# Patient Record
Sex: Male | Born: 1965 | Race: White | Hispanic: No | Marital: Married | Smoking: Never smoker
Health system: Southern US, Community
[De-identification: ages and names within clinical notes are randomized; demographics above are authoritative.]

## PROBLEM LIST (undated history)

## (undated) DIAGNOSIS — N289 Disorder of kidney and ureter, unspecified: Secondary | ICD-10-CM

## (undated) DIAGNOSIS — N200 Calculus of kidney: Secondary | ICD-10-CM

## (undated) DIAGNOSIS — I10 Essential (primary) hypertension: Secondary | ICD-10-CM

## (undated) DIAGNOSIS — E7800 Pure hypercholesterolemia, unspecified: Secondary | ICD-10-CM

---

## 2014-03-23 ENCOUNTER — Encounter (HOSPITAL_BASED_OUTPATIENT_CLINIC_OR_DEPARTMENT_OTHER): Payer: Self-pay | Admitting: Emergency Medicine

## 2014-03-23 ENCOUNTER — Emergency Department (HOSPITAL_BASED_OUTPATIENT_CLINIC_OR_DEPARTMENT_OTHER): Payer: PRIVATE HEALTH INSURANCE

## 2014-03-23 ENCOUNTER — Emergency Department (HOSPITAL_BASED_OUTPATIENT_CLINIC_OR_DEPARTMENT_OTHER)
Admission: EM | Admit: 2014-03-23 | Discharge: 2014-03-23 | Disposition: A | Discharge status: Home / Self Care | Payer: PRIVATE HEALTH INSURANCE | Attending: Emergency Medicine | Admitting: Emergency Medicine

## 2014-03-23 DIAGNOSIS — Z862 Personal history of diseases of the blood and blood-forming organs and certain disorders involving the immune mechanism: Secondary | ICD-10-CM | POA: Diagnosis not present

## 2014-03-23 DIAGNOSIS — Z8639 Personal history of other endocrine, nutritional and metabolic disease: Secondary | ICD-10-CM | POA: Insufficient documentation

## 2014-03-23 DIAGNOSIS — N201 Calculus of ureter: Secondary | ICD-10-CM | POA: Diagnosis not present

## 2014-03-23 DIAGNOSIS — Z79899 Other long term (current) drug therapy: Secondary | ICD-10-CM | POA: Diagnosis not present

## 2014-03-23 DIAGNOSIS — Z880 Allergy status to penicillin: Secondary | ICD-10-CM | POA: Diagnosis not present

## 2014-03-23 DIAGNOSIS — R109 Unspecified abdominal pain: Secondary | ICD-10-CM | POA: Insufficient documentation

## 2014-03-23 HISTORY — DX: Calculus of kidney: N20.0

## 2014-03-23 HISTORY — DX: Disorder of kidney and ureter, unspecified: N28.9

## 2014-03-23 HISTORY — DX: Pure hypercholesterolemia, unspecified: E78.00

## 2014-03-23 LAB — URINALYSIS, ROUTINE W REFLEX MICROSCOPIC
Bilirubin Urine: NEGATIVE
GLUCOSE, UA: NEGATIVE mg/dL
Hgb urine dipstick: NEGATIVE
KETONES UR: 15 mg/dL — AB
Leukocytes, UA: NEGATIVE
Nitrite: NEGATIVE
Protein, ur: NEGATIVE mg/dL
Specific Gravity, Urine: 1.026 (ref 1.005–1.030)
Urobilinogen, UA: 0.2 mg/dL (ref 0.0–1.0)
pH: 6 (ref 5.0–8.0)

## 2014-03-23 LAB — BASIC METABOLIC PANEL
Anion gap: 18 — ABNORMAL HIGH (ref 5–15)
BUN: 23 mg/dL (ref 6–23)
CO2: 21 mEq/L (ref 19–32)
CREATININE: 1.4 mg/dL — AB (ref 0.50–1.35)
Calcium: 10 mg/dL (ref 8.4–10.5)
Chloride: 101 mEq/L (ref 96–112)
GFR, EST AFRICAN AMERICAN: 68 mL/min — AB (ref >90)
GFR, EST NON AFRICAN AMERICAN: 59 mL/min — AB (ref >90)
GLUCOSE: 189 mg/dL — AB (ref 70–99)
POTASSIUM: 4.6 meq/L (ref 3.7–5.3)
Sodium: 140 mEq/L (ref 137–147)

## 2014-03-23 LAB — CBC
HEMATOCRIT: 40.9 % (ref 39.0–52.0)
HEMOGLOBIN: 14.9 g/dL (ref 13.0–17.0)
MCH: 30.2 pg (ref 26.0–34.0)
MCHC: 36.4 g/dL — AB (ref 30.0–36.0)
MCV: 82.8 fL (ref 78.0–100.0)
Platelets: 167 10*3/uL (ref 150–400)
RBC: 4.94 MIL/uL (ref 4.22–5.81)
RDW: 12.5 % (ref 11.5–15.5)
WBC: 8.8 10*3/uL (ref 4.0–10.5)

## 2014-03-23 MED ORDER — ONDANSETRON HCL 4 MG PO TABS: 4.0000 mg | ORAL_TABLET | Freq: Four times a day (QID) | ORAL | Status: DC

## 2014-03-23 MED ORDER — TAMSULOSIN HCL 0.4 MG PO CAPS: 0.4000 mg | ORAL_CAPSULE | Freq: Every day | ORAL | Status: DC

## 2014-03-23 MED ORDER — HYDROMORPHONE HCL PF 1 MG/ML IJ SOLN
1.0000 mg | Freq: Once | INTRAMUSCULAR | Status: AC
  Administered 2014-03-23: 1 mg via INTRAVENOUS
  Filled 2014-03-23: qty 1

## 2014-03-23 MED ORDER — OXYCODONE-ACETAMINOPHEN 5-325 MG PO TABS: 1.0000 | ORAL_TABLET | Freq: Four times a day (QID) | ORAL | Status: DC | PRN

## 2014-03-23 MED ORDER — ONDANSETRON HCL 4 MG/2ML IJ SOLN
4.0000 mg | Freq: Once | INTRAMUSCULAR | Status: AC
  Administered 2014-03-23: 4 mg via INTRAVENOUS
  Filled 2014-03-23: qty 2

## 2014-03-23 NOTE — ED Notes (Signed)
Pt seen at [facility], Sent for possible kidney stone. Pt has history.  100 mcg of Fentanyl. 18 G left hand put in by EMS.

## 2014-03-23 NOTE — Discharge Instructions (Signed)
Return to the ED with any concerns including pain not controlled by pain medication, vomiting and not able to keep down liquids, fever/chills, decreased level of alertness/lethargy, or any other alarming symptoms

## 2014-03-23 NOTE — ED Provider Notes (Signed)
[id number]  precautions.  Pt agreeable with plan.    Ethelda Chick, MD 03/25/14 [phone]

## 2014-08-09 ENCOUNTER — Encounter (HOSPITAL_COMMUNITY): Payer: Self-pay | Admitting: Emergency Medicine

## 2014-08-09 ENCOUNTER — Emergency Department (HOSPITAL_COMMUNITY)
Admission: EM | Admit: 2014-08-09 | Discharge: 2014-08-09 | Disposition: A | Discharge status: Home / Self Care | Payer: PRIVATE HEALTH INSURANCE | Attending: Emergency Medicine | Admitting: Emergency Medicine

## 2014-08-09 ENCOUNTER — Emergency Department (HOSPITAL_COMMUNITY): Payer: PRIVATE HEALTH INSURANCE

## 2014-08-09 DIAGNOSIS — Z8639 Personal history of other endocrine, nutritional and metabolic disease: Secondary | ICD-10-CM | POA: Insufficient documentation

## 2014-08-09 DIAGNOSIS — N200 Calculus of kidney: Secondary | ICD-10-CM

## 2014-08-09 DIAGNOSIS — Z880 Allergy status to penicillin: Secondary | ICD-10-CM | POA: Diagnosis not present

## 2014-08-09 DIAGNOSIS — R109 Unspecified abdominal pain: Secondary | ICD-10-CM

## 2014-08-09 DIAGNOSIS — R1030 Lower abdominal pain, unspecified: Secondary | ICD-10-CM | POA: Diagnosis present

## 2014-08-09 LAB — COMPREHENSIVE METABOLIC PANEL
ALBUMIN: 4.7 g/dL (ref 3.5–5.2)
ALT: 35 U/L (ref 0–53)
ANION GAP: 9 (ref 5–15)
AST: 37 U/L (ref 0–37)
Alkaline Phosphatase: 46 U/L (ref 39–117)
BUN: 17 mg/dL (ref 6–23)
CO2: 24 mmol/L (ref 19–32)
CREATININE: 1.38 mg/dL — AB (ref 0.50–1.35)
Calcium: 9.3 mg/dL (ref 8.4–10.5)
Chloride: 107 mEq/L (ref 96–112)
GFR calc non Af Amer: 60 mL/min — ABNORMAL LOW (ref >90)
GFR, EST AFRICAN AMERICAN: 69 mL/min — AB (ref >90)
GLUCOSE: 171 mg/dL — AB (ref 70–99)
POTASSIUM: 4.5 mmol/L (ref 3.5–5.1)
Sodium: 140 mmol/L (ref 135–145)
Total Bilirubin: 0.9 mg/dL (ref 0.3–1.2)
Total Protein: 7.7 g/dL (ref 6.0–8.3)

## 2014-08-09 LAB — URINALYSIS, ROUTINE W REFLEX MICROSCOPIC
BILIRUBIN URINE: NEGATIVE
Glucose, UA: NEGATIVE mg/dL
HGB URINE DIPSTICK: NEGATIVE
Ketones, ur: 15 mg/dL — AB
Leukocytes, UA: NEGATIVE
Nitrite: NEGATIVE
Protein, ur: NEGATIVE mg/dL
Specific Gravity, Urine: 1.031 — ABNORMAL HIGH (ref 1.005–1.030)
Urobilinogen, UA: 0.2 mg/dL (ref 0.0–1.0)
pH: 5 (ref 5.0–8.0)

## 2014-08-09 LAB — CBC WITH DIFFERENTIAL/PLATELET
BASOS ABS: 0 10*3/uL (ref 0.0–0.1)
Basophils Relative: 0 % (ref 0–1)
EOS ABS: 0 10*3/uL (ref 0.0–0.7)
EOS PCT: 0 % (ref 0–5)
HCT: 41.7 % (ref 39.0–52.0)
Hemoglobin: 15.1 g/dL (ref 13.0–17.0)
Lymphocytes Relative: 6 % — ABNORMAL LOW (ref 12–46)
Lymphs Abs: 0.7 10*3/uL (ref 0.7–4.0)
MCH: 30.4 pg (ref 26.0–34.0)
MCHC: 36.2 g/dL — ABNORMAL HIGH (ref 30.0–36.0)
MCV: 83.9 fL (ref 78.0–100.0)
MONO ABS: 0.4 10*3/uL (ref 0.1–1.0)
MONOS PCT: 4 % (ref 3–12)
Neutro Abs: 9.5 10*3/uL — ABNORMAL HIGH (ref 1.7–7.7)
Neutrophils Relative %: 90 % — ABNORMAL HIGH (ref 43–77)
Platelets: 136 10*3/uL — ABNORMAL LOW (ref 150–400)
RBC: 4.97 MIL/uL (ref 4.22–5.81)
RDW: 12.5 % (ref 11.5–15.5)
WBC: 10.6 10*3/uL — ABNORMAL HIGH (ref 4.0–10.5)

## 2014-08-09 MED ORDER — TAMSULOSIN HCL 0.4 MG PO CAPS: 0.4000 mg | ORAL_CAPSULE | Freq: Every day | ORAL | Status: DC

## 2014-08-09 MED ORDER — HYDROMORPHONE HCL 1 MG/ML IJ SOLN
1.0000 mg | Freq: Once | INTRAMUSCULAR | Status: AC
  Administered 2014-08-09: 1 mg via INTRAVENOUS
  Filled 2014-08-09: qty 1

## 2014-08-09 MED ORDER — ONDANSETRON 4 MG PO TBDP: 4.0000 mg | ORAL_TABLET | Freq: Three times a day (TID) | ORAL | Status: DC | PRN

## 2014-08-09 MED ORDER — KETOROLAC TROMETHAMINE 30 MG/ML IJ SOLN
30.0000 mg | Freq: Once | INTRAMUSCULAR | Status: AC
  Administered 2014-08-09: 30 mg via INTRAVENOUS
  Filled 2014-08-09: qty 1

## 2014-08-09 MED ORDER — HYDROCODONE-ACETAMINOPHEN 5-325 MG PO TABS: 1.0000 | ORAL_TABLET | ORAL | Status: DC | PRN

## 2014-08-09 MED ORDER — TAMSULOSIN HCL 0.4 MG PO CAPS
0.4000 mg | ORAL_CAPSULE | Freq: Once | ORAL | Status: AC
  Administered 2014-08-09: 0.4 mg via ORAL
  Filled 2014-08-09: qty 1

## 2014-08-09 MED ORDER — HYDROMORPHONE HCL 1 MG/ML IJ SOLN
0.5000 mg | Freq: Once | INTRAMUSCULAR | Status: AC
  Administered 2014-08-09: 0.5 mg via INTRAVENOUS
  Filled 2014-08-09: qty 1

## 2014-08-09 NOTE — Discharge Instructions (Signed)

## 2014-08-09 NOTE — ED Notes (Signed)
pt attempted to urinate, but was unsuccessful.

## 2014-08-09 NOTE — ED Notes (Signed)
PA at bedside.

## 2014-08-09 NOTE — ED Notes (Signed)
Bed: WA08 Expected date:  Expected time:  Means of arrival:  Comments: [facility]- [facility]

## 2014-08-09 NOTE — ED Provider Notes (Signed)
CSN: [id number]     Arrival date & time 08/09/14  1432 History   First MD Initiated Contact with Patient 08/09/14 1506     Chief Complaint  Patient presents with  . Dysuria  . Abdominal Pain   Troy Holt is a 49 y.o. male with a history of kidney stones presents to the emergency department complaining of suprapubic pain and difficulty urinating since 9 AM this morning. The patient reports he was seen at [facility] earlier and sent to the emergency department for evaluation. Patient reports 2 previous kidney stones and reports similar symptoms with his previous kidney stone. The patient reports he was only able to urinate a very small amount since 9 AM this morning. Patient also reports 8 out of 10 suprapubic pain that is worse with movement and pressure. The patient is receiving 250 g of fentanyl by EMS prior to arrival. Patient denies back pain or previous problems with his prostate. Patient has not seen a urologist for his previous stones. Patient had a right ureteral stone in September 2015. The patient denies fevers, chills, nausea, vomiting, diarrhea, constipation, hematuria, rashes, penile pain, scrotal pain, testicular pain, or genital lesions.  (Consider location/radiation/quality/duration/timing/severity/associated sxs/prior Treatment) HPI  Past Medical History  Diagnosis Date  . Renal disorder   . Kidney stone   . High cholesterol    History reviewed. No pertinent past surgical history. History reviewed. No pertinent family history. History  Substance Use Topics  . Smoking status: Never Smoker   . Smokeless tobacco: Not on file  . Alcohol Use: No    Review of Systems  Constitutional: Negative for fever and chills.  HENT: Negative for congestion and sore throat.   Eyes: Negative for visual disturbance.  Respiratory: Negative for cough, shortness of breath and wheezing.   Cardiovascular: Negative for chest pain and palpitations.  Gastrointestinal: Positive  for abdominal pain. Negative for nausea, vomiting, diarrhea and blood in stool.  Genitourinary: Positive for decreased urine volume and difficulty urinating. Negative for dysuria, frequency, hematuria, flank pain, discharge, penile swelling, genital sores, penile pain and testicular pain.  Musculoskeletal: Negative for back pain and neck pain.  Skin: Negative for rash.  Neurological: Negative for weakness and headaches.      Allergies  Penicillins  Home Medications   Prior to Admission medications   Medication Sig Start Date End Date Taking? Authorizing Provider  oxyCODONE-acetaminophen (PERCOCET/ROXICET) 5-325 MG per tablet Take 1-2 tablets by mouth every 6 (six) hours as needed for severe pain. 03/23/14  Yes Ethelda Chick, MD  HYDROcodone-acetaminophen (NORCO/VICODIN) 5-325 MG per tablet Take 1-2 tablets by mouth every 4 (four) hours as needed for moderate pain or severe pain. 08/09/14   Einar Gip Alva Kuenzel, PA-C  ondansetron (ZOFRAN ODT) 4 MG disintegrating tablet Take 1 tablet (4 mg total) by mouth every 8 (eight) hours as needed for nausea or vomiting. 08/09/14   Einar Gip Ellasyn Swilling, PA-C  ondansetron (ZOFRAN) 4 MG tablet Take 1 tablet (4 mg total) by mouth every 6 (six) hours. 03/23/14   Ethelda Chick, MD  tamsulosin (FLOMAX) 0.4 MG CAPS capsule Take 1 capsule (0.4 mg total) by mouth daily. 08/09/14   Einar Gip Aviela Blundell, PA-C   BP 153/87 mmHg  Pulse 85  Temp(Src) 98.1 F (36.7 C) (Oral)  Resp 18  SpO2 97% Physical Exam  Constitutional: He is oriented to person, place, and time. He appears well-developed and well-nourished. No distress.  HENT:  Head: Normocephalic and atraumatic.  Right Ear: External  ear normal.  Left Ear: External ear normal.  Mouth/Throat: Oropharynx is clear and moist. No oropharyngeal exudate.  Dry mucous membranes  Eyes: Conjunctivae are normal. Pupils are equal, round, and reactive to light. Right eye exhibits no discharge. Left eye exhibits no  discharge.  Neck: Neck supple.  Cardiovascular: Normal rate, regular rhythm, normal heart sounds and intact distal pulses.  Exam reveals no gallop and no friction rub.   No murmur heard. Pulmonary/Chest: Effort normal and breath sounds normal. No respiratory distress. He has no wheezes. He has no rales.  Abdominal: Soft. Bowel sounds are normal. He exhibits no distension and no mass. There is tenderness. There is no rebound and no guarding.  Patient has mild superpubic tenderness to palpation. No right lower quadrant tenderness. Patient's abdomen is soft and bowel sounds are present.  Genitourinary: Testes normal and penis normal. Right testis shows no swelling and no tenderness. Left testis shows no swelling and no tenderness. Uncircumcised. No phimosis, paraphimosis, hypospadias, penile erythema or penile tenderness. No discharge found.  No genital lesions noted. No scrotal swelling or penile swelling noted. Penis and scrotum are nontender to palpation. Scrotal contents is normal.  Musculoskeletal: He exhibits no edema.  Lymphadenopathy:    He has no cervical adenopathy.  Neurological: He is alert and oriented to person, place, and time. Coordination normal.  Skin: Skin is warm and dry. No rash noted. He is not diaphoretic. No erythema. No pallor.  Psychiatric: He has a normal mood and affect. His behavior is normal.  Nursing note and vitals reviewed.   ED Course  Procedures (including critical care time) Labs Review Labs Reviewed  URINALYSIS, ROUTINE W REFLEX MICROSCOPIC - Abnormal; Notable for the following:    Specific Gravity, Urine 1.031 (*)    Ketones, ur 15 (*)    All other components within normal limits  COMPREHENSIVE METABOLIC PANEL - Abnormal; Notable for the following:    Glucose, Bld 171 (*)    Creatinine, Ser 1.38 (*)    GFR calc non Af Amer 60 (*)    GFR calc Af Amer 69 (*)    All other components within normal limits  CBC WITH DIFFERENTIAL - Abnormal; Notable for the  following:    WBC 10.6 (*)    MCHC 36.2 (*)    Platelets 136 (*)    Neutrophils Relative % 90 (*)    Neutro Abs 9.5 (*)    Lymphocytes Relative 6 (*)    All other components within normal limits  URINE CULTURE    Imaging Review US Renal  08/09/2014   CLINICAL DATA:  49 year old with mid abdominal pain. History of kidney stone.  EXAM: RENAL/URINARY TRACT ULTRASOUND COMPLETE  COMPARISON:  CT 03/23/2014  FINDINGS: Right Kidney:  Length: 12.6 cm. Echogenicity within normal limits. No mass or hydronephrosis visualized.  Left Kidney:  Length: 14.1 cm. Normal echogenicity in the left kidney. There is fullness of the left renal pelvis.  Bladder:  Bladder is decompressed and poorly visualized.  IMPRESSION: Mild left hydronephrosis of unknown etiology. This may be further characterized with CT.   Electronically Signed   By: Richarda Overlie M.D.   On: 08/09/2014 17:35     EKG Interpretation None      Filed Vitals:   08/09/14 1437 08/09/14 1440 08/09/14 1729 08/09/14 1930  BP:  123/72 145/89 153/87  Pulse:  82 74 85  Temp:  97.8 F (36.6 C)  98.1 F (36.7 C)  TempSrc:  Oral  Oral  Resp:  24 20 18   SpO2: 100% 98% 95% 97%     MDM   Meds given in ED:  Medications  HYDROmorphone (DILAUDID) injection 1 mg (1 mg Intravenous Given 08/09/14 1700)  HYDROmorphone (DILAUDID) injection 0.5 mg (0.5 mg Intravenous Given 08/09/14 1833)  ketorolac (TORADOL) 30 MG/ML injection 30 mg (30 mg Intravenous Given 08/09/14 1832)  tamsulosin (FLOMAX) capsule 0.4 mg (0.4 mg Oral Given 08/09/14 1931)    Discharge Medication List as of 08/09/2014  7:18 PM    START taking these medications   Details  HYDROcodone-acetaminophen (NORCO/VICODIN) 5-325 MG per tablet Take 1-2 tablets by mouth every 4 (four) hours as needed for moderate pain or severe pain., Starting 08/09/2014, Until Discontinued, Print    ondansetron (ZOFRAN ODT) 4 MG disintegrating tablet Take 1 tablet (4 mg total) by mouth every 8 (eight) hours as needed  for nausea or vomiting., Starting 08/09/2014, Until Discontinued, Print        Final diagnoses:  Abdominal pain  Kidney stone on left side   Visit 49 year old male with a history of kidney stones who presents the emergency department complaining of suprapubic abdominal pain and decreased amount of urine since 5 AM this morning. Patient had small amount of urine on his bladder scan and foley cath was easily placed. There was a small amount of urine from cath. So this is not likely obstruction. Renal ultrasound revealed left mild hydronephrosis. Patient seemed indicated a creatinine of 1.38 which is similar to his blood work and his previous stone. CBC is unremarkable.   At Reevaluation the patient is complaining of left flank pain at 8 out of 10. Patient given Toradol and Dilaudid and had complete relief of his pain. Patient is tolerating by mouth liquids and reports feeling much better. Patient is afebrile and nontoxic appearing. Patient discharged with prescriptions for Norco, Zofran and Flomax. Narcotic pain medication precautions provided.  Advised patient to follow-up with urology this week. Strict return precautions provided. I advised the patient to follow-up with their primary care provider this week. I advised the patient to return to the emergency department with new or worsening symptoms or new concerns. The patient verbalized understanding and agreement with plan.   This patient was discussed with Dr. Rubin PayorPickering who agrees with assessment and plan.     Lawana ChambersWilliam Duncan Kassim Guertin, PA-C 08/09/14 2352  Juliet RudeNathan R. Rubin PayorPickering, MD 08/10/14 0001

## 2014-08-09 NOTE — ED Notes (Signed)
Per EMs. Pt [facility], was sent here for further evaluation of abd pain and hematuria. Symptoms started at 0900 today. Pain is in suprapubic area. EMS gave 250mcg fentanyl prior to arrival.

## 2014-08-09 NOTE — ED Notes (Signed)
Pt requesting to walk to bathroom does not wish to use urinal

## 2014-08-09 NOTE — ED Notes (Signed)
US at bedside

## 2014-08-09 NOTE — ED Notes (Signed)
Pt being sent by EMS from [facility]. C/o lower abdominal pain, bladder distention, and urinary retention.  Hx of kidney stones.  Last episode September 2015.

## 2014-08-11 ENCOUNTER — Emergency Department (HOSPITAL_COMMUNITY)
Admission: EM | Admit: 2014-08-11 | Discharge: 2014-08-11 | Disposition: A | Discharge status: Home / Self Care | Payer: PRIVATE HEALTH INSURANCE | Attending: Emergency Medicine | Admitting: Emergency Medicine

## 2014-08-11 ENCOUNTER — Emergency Department (HOSPITAL_COMMUNITY): Payer: PRIVATE HEALTH INSURANCE

## 2014-08-11 ENCOUNTER — Encounter (HOSPITAL_COMMUNITY): Payer: Self-pay | Admitting: Emergency Medicine

## 2014-08-11 DIAGNOSIS — N23 Unspecified renal colic: Secondary | ICD-10-CM | POA: Diagnosis not present

## 2014-08-11 DIAGNOSIS — Z880 Allergy status to penicillin: Secondary | ICD-10-CM | POA: Diagnosis not present

## 2014-08-11 DIAGNOSIS — Z8639 Personal history of other endocrine, nutritional and metabolic disease: Secondary | ICD-10-CM | POA: Diagnosis not present

## 2014-08-11 DIAGNOSIS — N289 Disorder of kidney and ureter, unspecified: Secondary | ICD-10-CM | POA: Insufficient documentation

## 2014-08-11 DIAGNOSIS — Z79899 Other long term (current) drug therapy: Secondary | ICD-10-CM | POA: Diagnosis not present

## 2014-08-11 DIAGNOSIS — R109 Unspecified abdominal pain: Secondary | ICD-10-CM | POA: Diagnosis present

## 2014-08-11 LAB — CBC WITH DIFFERENTIAL/PLATELET
BASOS ABS: 0 10*3/uL (ref 0.0–0.1)
BASOS PCT: 0 % (ref 0–1)
EOS ABS: 0 10*3/uL (ref 0.0–0.7)
EOS PCT: 1 % (ref 0–5)
HEMATOCRIT: 39 % (ref 39.0–52.0)
Hemoglobin: 13.8 g/dL (ref 13.0–17.0)
Lymphocytes Relative: 14 % (ref 12–46)
Lymphs Abs: 1.2 10*3/uL (ref 0.7–4.0)
MCH: 29.9 pg (ref 26.0–34.0)
MCHC: 35.4 g/dL (ref 30.0–36.0)
MCV: 84.4 fL (ref 78.0–100.0)
MONOS PCT: 9 % (ref 3–12)
Monocytes Absolute: 0.7 10*3/uL (ref 0.1–1.0)
NEUTROS ABS: 6.4 10*3/uL (ref 1.7–7.7)
NEUTROS PCT: 76 % (ref 43–77)
Platelets: 133 10*3/uL — ABNORMAL LOW (ref 150–400)
RBC: 4.62 MIL/uL (ref 4.22–5.81)
RDW: 12.2 % (ref 11.5–15.5)
WBC: 8.4 10*3/uL (ref 4.0–10.5)

## 2014-08-11 LAB — BASIC METABOLIC PANEL
ANION GAP: 9 (ref 5–15)
BUN: 22 mg/dL (ref 6–23)
CO2: 25 mmol/L (ref 19–32)
Calcium: 8.7 mg/dL (ref 8.4–10.5)
Chloride: 98 mEq/L (ref 96–112)
Creatinine, Ser: 1.84 mg/dL — ABNORMAL HIGH (ref 0.50–1.35)
GFR calc Af Amer: 49 mL/min — ABNORMAL LOW (ref >90)
GFR, EST NON AFRICAN AMERICAN: 42 mL/min — AB (ref >90)
Glucose, Bld: 142 mg/dL — ABNORMAL HIGH (ref 70–99)
POTASSIUM: 4 mmol/L (ref 3.5–5.1)
SODIUM: 132 mmol/L — AB (ref 135–145)

## 2014-08-11 LAB — URINALYSIS, ROUTINE W REFLEX MICROSCOPIC
Bilirubin Urine: NEGATIVE
GLUCOSE, UA: NEGATIVE mg/dL
HGB URINE DIPSTICK: NEGATIVE
Ketones, ur: NEGATIVE mg/dL
Leukocytes, UA: NEGATIVE
Nitrite: NEGATIVE
Protein, ur: NEGATIVE mg/dL
SPECIFIC GRAVITY, URINE: 1.023 (ref 1.005–1.030)
Urobilinogen, UA: 0.2 mg/dL (ref 0.0–1.0)
pH: 5.5 (ref 5.0–8.0)

## 2014-08-11 LAB — URINE CULTURE
COLONY COUNT: NO GROWTH
CULTURE: NO GROWTH

## 2014-08-11 MED ORDER — HYDROMORPHONE HCL 1 MG/ML IJ SOLN
1.0000 mg | Freq: Once | INTRAMUSCULAR | Status: AC
  Administered 2014-08-11: 1 mg via INTRAVENOUS
  Filled 2014-08-11: qty 1

## 2014-08-11 MED ORDER — ONDANSETRON HCL 4 MG/2ML IJ SOLN
4.0000 mg | Freq: Once | INTRAMUSCULAR | Status: AC
  Administered 2014-08-11: 4 mg via INTRAVENOUS
  Filled 2014-08-11: qty 2

## 2014-08-11 MED ORDER — SODIUM CHLORIDE 0.9 % IV BOLUS (SEPSIS)
1000.0000 mL | Freq: Once | INTRAVENOUS | Status: AC
  Administered 2014-08-11: 1000 mL via INTRAVENOUS

## 2014-08-11 MED ORDER — OXYCODONE-ACETAMINOPHEN 5-325 MG PO TABS: 2.0000 | ORAL_TABLET | Freq: Four times a day (QID) | ORAL | Status: DC | PRN

## 2014-08-11 NOTE — ED Notes (Signed)
Patient requesting pain medicine at this time, will notify RN Annabelle Harmanana

## 2014-08-11 NOTE — ED Provider Notes (Signed)
CSN: [id number]     Arrival date & time 08/11/14  1000 History   First MD Initiated Contact with Patient 08/11/14 1020     Chief Complaint  Patient presents with  . Flank Pain     Patient is a 49 y.o. male presenting with flank pain. The history is provided by the patient and a relative. No language interpreter was used.  Flank Pain   Troy Holt presents for evaluation of left flank pain. He reports pain in his left flank radiating to the left groin that started yesterday. Pain is described as pain and constant nature. He has no fevers, vomiting, diarrhea, dysuria. He was seen in the emergency department and diagnosed with kidney stones. He started on Flomax and Norco. He states the pain medicine does not help his pain. He states he is urinating better than he did yesterday. He does not yet have urology follow-up. Symptoms are moderate, constant, worsening.  Past Medical History  Diagnosis Date  . Renal disorder   . Kidney stone   . High cholesterol    History reviewed. No pertinent past surgical history. No family history on file. History  Substance Use Topics  . Smoking status: Never Smoker   . Smokeless tobacco: Not on file  . Alcohol Use: No    Review of Systems  Genitourinary: Positive for flank pain.  All other systems reviewed and are negative.     Allergies  Penicillins  Home Medications   Prior to Admission medications   Medication Sig Start Date End Date Taking? Authorizing Provider  HYDROcodone-acetaminophen (NORCO/VICODIN) 5-325 MG per tablet Take 1-2 tablets by mouth every 4 (four) hours as needed for moderate pain or severe pain. 08/09/14  Yes Einar Gip Dansie, PA-C  ondansetron (ZOFRAN ODT) 4 MG disintegrating tablet Take 1 tablet (4 mg total) by mouth every 8 (eight) hours as needed for nausea or vomiting. 08/09/14  Yes Einar Gip Dansie, PA-C  oxyCODONE-acetaminophen (PERCOCET/ROXICET) 5-325 MG per tablet Take 1-2 tablets by mouth every 6 (six)  hours as needed for severe pain. 03/23/14  Yes Ethelda Chick, MD  tamsulosin (FLOMAX) 0.4 MG CAPS capsule Take 1 capsule (0.4 mg total) by mouth daily. 08/09/14  Yes Einar Gip Dansie, PA-C  ondansetron (ZOFRAN) 4 MG tablet Take 1 tablet (4 mg total) by mouth every 6 (six) hours. Patient not taking: Reported on 08/11/2014 03/23/14   Ethelda Chick, MD   BP 155/97 mmHg  Pulse 80  Temp(Src) 97.7 F (36.5 C) (Oral)  Resp 20  SpO2 95% Physical Exam  Constitutional: He is oriented to person, place, and time. He appears well-developed and well-nourished.  HENT:  Head: Normocephalic and atraumatic.  Cardiovascular: Normal rate and regular rhythm.   No murmur heard. Pulmonary/Chest: Effort normal and breath sounds normal. No respiratory distress.  Abdominal: Soft. There is no rebound and no guarding.  Mild left-sided abdominal tenderness and left CVA tenderness without guarding or rebound  Musculoskeletal: He exhibits no edema or tenderness.  Neurological: He is alert and oriented to person, place, and time.  Skin: Skin is warm and dry.  Psychiatric: He has a normal mood and affect. His behavior is normal.  Nursing note and vitals reviewed.   ED Course  Procedures (including critical care time) Labs Review Labs Reviewed  BASIC METABOLIC PANEL - Abnormal; Notable for the following:    Sodium 132 (*)    Glucose, Bld 142 (*)    Creatinine, Ser 1.84 (*)    GFR calc  non Af Amer 42 (*)    GFR calc Af Amer 49 (*)    All other components within normal limits  CBC WITH DIFFERENTIAL - Abnormal; Notable for the following:    Platelets 133 (*)    All other components within normal limits  URINE CULTURE  URINALYSIS, ROUTINE W REFLEX MICROSCOPIC    Imaging Review [place] Renal  08/09/2014   CLINICAL DATA:  49 year old with mid abdominal pain. History of kidney stone.  EXAM: RENAL/URINARY TRACT ULTRASOUND COMPLETE  COMPARISON:  CT 03/23/2014  FINDINGS: Right Kidney:  Length: 12.6 cm. Echogenicity  within normal limits. No mass or hydronephrosis visualized.  Left Kidney:  Length: 14.1 cm. Normal echogenicity in the left kidney. There is fullness of the left renal pelvis.  Bladder:  Bladder is decompressed and poorly visualized.  IMPRESSION: Mild left hydronephrosis of unknown etiology. This may be further characterized with CT.   Electronically Signed   By: Richarda OverlieAdam  Henn M.D.   On: 08/09/2014 17:35     EKG Interpretation None      MDM   Final diagnoses:  Renal colic on left side  Renal insufficiency    Patient here with left flank pain, CT with obstructing stone that is 3 mm. UA is not consistent with infection. Patient has baseline renal insufficiency which is slightly worse today. Patient's pain is improved in the department following Dilaudid. Discussed with patient and close urology follow-up. Changing Norco to Percocet. Patient is continue Zofran as needed as well as Flomax. Return precautions were discussed. Discussed with patient findings of renal insufficiency and need to see primary care physician for follow-up regarding this.  D/w Dr. Vernie Ammonsttelin with Urology - recommends pain control, flomax, outpatient followup.    Troy FossaElizabeth Quantae Martel, MD 08/11/14 [phone]

## 2014-08-11 NOTE — Discharge Instructions (Signed)

## 2014-08-11 NOTE — ED Notes (Signed)
Initial Contact - pt A+Ox4, ambulatory with steady gait.  Reports hx of kidney stones and just here for same, has not yet followed up with urology. C/o L flank pain.  Skin PWD.  MAEI.  Speaking full/clear sentences.  NAD.

## 2014-08-11 NOTE — ED Notes (Signed)
Pt ambulatory with steady gait to void in BR, reports pain "much better" than before.  Denies further needs/complaints at this time.  NAD.

## 2014-08-11 NOTE — ED Notes (Signed)
Per pt, states he was here on 19 th, 20 th for the same symptoms-has kidney stones-has not followed up with urology, no dysuria-left flank pain

## 2014-08-12 LAB — URINE CULTURE: Colony Count: 8000

## 2015-02-17 ENCOUNTER — Emergency Department (HOSPITAL_BASED_OUTPATIENT_CLINIC_OR_DEPARTMENT_OTHER): Payer: Worker's Compensation

## 2015-02-17 ENCOUNTER — Emergency Department (HOSPITAL_BASED_OUTPATIENT_CLINIC_OR_DEPARTMENT_OTHER)
Admission: EM | Admit: 2015-02-17 | Discharge: 2015-02-17 | Disposition: A | Discharge status: Home / Self Care | Payer: Worker's Compensation | Attending: Emergency Medicine | Admitting: Emergency Medicine

## 2015-02-17 ENCOUNTER — Encounter (HOSPITAL_BASED_OUTPATIENT_CLINIC_OR_DEPARTMENT_OTHER): Payer: Self-pay | Admitting: *Deleted

## 2015-02-17 DIAGNOSIS — Z87448 Personal history of other diseases of urinary system: Secondary | ICD-10-CM | POA: Diagnosis not present

## 2015-02-17 DIAGNOSIS — Y9289 Other specified places as the place of occurrence of the external cause: Secondary | ICD-10-CM | POA: Diagnosis not present

## 2015-02-17 DIAGNOSIS — Z8639 Personal history of other endocrine, nutritional and metabolic disease: Secondary | ICD-10-CM | POA: Diagnosis not present

## 2015-02-17 DIAGNOSIS — Y990 Civilian activity done for income or pay: Secondary | ICD-10-CM | POA: Insufficient documentation

## 2015-02-17 DIAGNOSIS — Z880 Allergy status to penicillin: Secondary | ICD-10-CM | POA: Insufficient documentation

## 2015-02-17 DIAGNOSIS — S46911A Strain of unspecified muscle, fascia and tendon at shoulder and upper arm level, right arm, initial encounter: Secondary | ICD-10-CM

## 2015-02-17 DIAGNOSIS — X58XXXA Exposure to other specified factors, initial encounter: Secondary | ICD-10-CM | POA: Diagnosis not present

## 2015-02-17 DIAGNOSIS — Y9389 Activity, other specified: Secondary | ICD-10-CM | POA: Diagnosis not present

## 2015-02-17 DIAGNOSIS — I10 Essential (primary) hypertension: Secondary | ICD-10-CM | POA: Diagnosis not present

## 2015-02-17 DIAGNOSIS — Z87442 Personal history of urinary calculi: Secondary | ICD-10-CM | POA: Insufficient documentation

## 2015-02-17 DIAGNOSIS — S6991XA Unspecified injury of right wrist, hand and finger(s), initial encounter: Secondary | ICD-10-CM | POA: Diagnosis present

## 2015-02-17 HISTORY — DX: Essential (primary) hypertension: I10

## 2015-02-17 MED ORDER — KETOROLAC TROMETHAMINE 60 MG/2ML IM SOLN
60.0000 mg | Freq: Once | INTRAMUSCULAR | Status: AC
  Administered 2015-02-17: 60 mg via INTRAMUSCULAR
  Filled 2015-02-17: qty 2

## 2015-02-17 MED ORDER — DIAZEPAM 5 MG PO TABS
5.0000 mg | ORAL_TABLET | Freq: Once | ORAL | Status: AC
  Administered 2015-02-17: 5 mg via ORAL
  Filled 2015-02-17: qty 1

## 2015-02-17 NOTE — Discharge Instructions (Signed)
Take 4 over the counter ibuprofen tablets 3 times a day or 2 over-the-counter naproxen tablets twice a day for pain.  Strain A muscle strain is an injury that occurs when a muscle is stretched beyond its normal length. Usually a small number of muscle fibers are torn when this happens. Muscle strain is rated in degrees. First-degree strains have the least amount of muscle fiber tearing and pain. Second-degree and third-degree strains have increasingly more tearing and pain.  Usually, recovery from muscle strain takes 1-2 weeks. Complete healing takes 5-6 weeks.  CAUSES  Muscle strain happens when a sudden, violent force placed on a muscle stretches it too far. This may occur with lifting, sports, or a fall.  RISK FACTORS Muscle strain is especially common in athletes.  SIGNS AND SYMPTOMS At the site of the muscle strain, there may be:  Pain.  Bruising.  Swelling.  Difficulty using the muscle due to pain or lack of normal function. DIAGNOSIS  Your health care provider will perform a physical exam and ask about your medical history. TREATMENT  Often, the best treatment for a muscle strain is resting, icing, and applying cold compresses to the injured area.  HOME CARE INSTRUCTIONS   Use the PRICE method of treatment to promote muscle healing during the first 2-3 days after your injury. The PRICE method involves:  Protecting the muscle from being injured again.  Restricting your activity and resting the injured body part.  Icing your injury. To do this, put ice in a plastic bag. Place a towel between your skin and the bag. Then, apply the ice and leave it on from 15-20 minutes each hour. After the third day, switch to moist heat packs.  Apply compression to the injured area with a splint or elastic bandage. Be careful not to wrap it too tightly. This may interfere with blood circulation or increase swelling.  Elevate the injured body part above the level of your heart as often as you  can.  Only take over-the-counter or prescription medicines for pain, discomfort, or fever as directed by your health care provider.  Warming up prior to exercise helps to prevent future muscle strains. SEEK MEDICAL CARE IF:   You have increasing pain or swelling in the injured area.  You have numbness, tingling, or a significant loss of strength in the injured area. MAKE SURE YOU:   Understand these instructions.  Will watch your condition.  Will get help right away if you are not doing well or get worse. Document Released: 07/08/2005 Document Revised: 04/28/2013 Document Reviewed: 02/04/2013 [facility] Patient Information 2015 [place], [place]. This information is not intended to replace advice given to you by your health care provider. Make sure you discuss any questions you have with your health care provider.

## 2015-02-17 NOTE — ED Notes (Signed)
Right shoulder pain since rotating at work while carrying something.  Reports neck, shoulder and arm pain.

## 2015-02-17 NOTE — ED Provider Notes (Signed)
CSN: [id number]     Arrival date & time 02/17/15  1955 History   First MD Initiated Contact with Patient 02/17/15 2011     Chief Complaint  Patient presents with  . Shoulder Injury     (Consider location/radiation/quality/duration/timing/severity/associated sxs/prior Treatment) Patient is a 49 y.o. male presenting with shoulder injury. The history is provided by the patient.  Shoulder Injury This is a new problem. The current episode started 1 to 2 hours ago. The problem occurs constantly. The problem has been gradually worsening. Pertinent negatives include no chest pain, no abdominal pain, no headaches and no shortness of breath. The symptoms are aggravated by twisting. Nothing relieves the symptoms. He has tried a cold compress for the symptoms. The treatment provided no relief.   49 yo M with a chief complaint right shoulder pain. 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 Patient states that he had a sudden onset of sharp pain to the posterior aspect of the right shoulder. Worse with movement of the arm. Patient states that he has had decreased grip strength as well as worsening pain when he takes a big deep breath.  Past Medical History  Diagnosis Date  . Renal disorder   . Kidney stone   . High cholesterol   . Hypertension    History reviewed. No pertinent past surgical history. History reviewed. No pertinent family history. History  Substance Use Topics  . Smoking status: Never Smoker   . Smokeless tobacco: Not on file  . Alcohol Use: No    Review of Systems  Constitutional: Negative for fever and chills.  HENT: Negative for congestion and facial swelling.   Eyes: Negative for discharge and visual disturbance.  Respiratory: Negative for shortness of breath.   Cardiovascular: Negative for chest pain and palpitations.  Gastrointestinal: Negative for vomiting, abdominal pain and  diarrhea.  Musculoskeletal: Positive for myalgias and arthralgias.  Skin: Negative for color change and rash.  Neurological: Negative for tremors, syncope and headaches.  Psychiatric/Behavioral: Negative for confusion and dysphoric mood.      Allergies  Penicillins  Home Medications   Prior to Admission medications   Not on File   BP 173/119 mmHg  Pulse 72  Temp(Src) 98.6 F (37 C) (Oral)  Resp 18  Ht  (1.778 m)  Wt 235 lb (106.595 kg)  BMI 33.72 kg/m2  SpO2 99% Physical Exam  Constitutional: He is oriented to person, place, and time. He appears well-developed and well-nourished.  HENT:  Head: Normocephalic and atraumatic.  Eyes: EOM are normal. Pupils are equal, round, and reactive to light.  Neck: Normal range of motion. Neck supple. No JVD present.  Cardiovascular: Normal rate and regular rhythm.  Exam reveals no gallop and no friction rub.   No murmur heard. Pulmonary/Chest: No respiratory distress. He has no wheezes.  Abdominal: He exhibits no distension. There is no rebound and no guarding.  Musculoskeletal: Normal range of motion. He exhibits edema and tenderness.  Tear palpation worse in the right trapezius muscle. Patient has range of motion limited secondary to pain. Pulses and motor is intact however the patient denies sensation of light touch.  Neurological: He is alert and oriented to person, place, and time.  Skin: No rash noted. No pallor.  Psychiatric: He has a normal mood and affect. His behavior is normal.    ED Course  Procedures (including critical care time)  Labs Review Labs Reviewed - No data to display  Imaging Review Dg Shoulder Right  02/17/2015   CLINICAL DATA:  RIGHT shoulder pain since 4 p.m.  Work injury.  EXAM: RIGHT SHOULDER - 2+ VIEW  COMPARISON:  None.  FINDINGS: Glenohumeral joint is intact. No evidence of scapular fracture or humeral fracture. The acromioclavicular joint is intact.  IMPRESSION: No fracture or dislocation.    Electronically Signed   By: Genevive Bi M.D.   On: 02/17/2015 20:26     EKG Interpretation None      MDM   Final diagnoses:  Right shoulder strain, initial encounter    49 yo M with right shoulder pain. Likely a right trapezius strain.  Patient with severe spasm and tenderness to the trapezius muscle. Patient with some subjective decreased sensation to light touch likely secondary to the amount of spasm to the trapezius. Patient also with limited grip strength to that hand. Will place the patient in a right arm sling. X-ray negative for acute fracture or dislocation as read by me. Patient will follow up with his PCP if continues to have decreased sensation will need ortho follow-up.  8:52 PM:  I have discussed the diagnosis/risks/treatment options with the patient and believe the pt to be eligible for discharge home to follow-up with PCP. We also discussed returning to the ED immediately if new or worsening sx occur. We discussed the sx which are most concerning (e.g., continued loss of sensation, decreased strength) that necessitate immediate return. Medications administered to the patient during their visit and any new prescriptions provided to the patient are listed below.  Medications given during this visit Medications  ketorolac (TORADOL) injection 60 mg (not administered)  diazepam (VALIUM) tablet 5 mg (not administered)    New Prescriptions   No medications on file     The patient appears reasonably screen and/or stabilized for discharge and I doubt any other medical condition or other [facility] requiring further screening, evaluation, or treatment in the ED at this time prior to discharge.    Melene Plan, DO 02/17/15 2052

## 2015-02-17 NOTE — ED Notes (Signed)
Employer at bedside driving patient .

## 2015-02-19 ENCOUNTER — Emergency Department (HOSPITAL_BASED_OUTPATIENT_CLINIC_OR_DEPARTMENT_OTHER)
Admission: EM | Admit: 2015-02-19 | Discharge: 2015-02-19 | Disposition: A | Discharge status: Home / Self Care | Payer: Worker's Compensation | Attending: Emergency Medicine | Admitting: Emergency Medicine

## 2015-02-19 ENCOUNTER — Encounter (HOSPITAL_BASED_OUTPATIENT_CLINIC_OR_DEPARTMENT_OTHER): Payer: Self-pay | Admitting: Emergency Medicine

## 2015-02-19 DIAGNOSIS — Z87448 Personal history of other diseases of urinary system: Secondary | ICD-10-CM | POA: Diagnosis not present

## 2015-02-19 DIAGNOSIS — M549 Dorsalgia, unspecified: Secondary | ICD-10-CM | POA: Insufficient documentation

## 2015-02-19 DIAGNOSIS — M25511 Pain in right shoulder: Secondary | ICD-10-CM | POA: Insufficient documentation

## 2015-02-19 DIAGNOSIS — Z87442 Personal history of urinary calculi: Secondary | ICD-10-CM | POA: Diagnosis not present

## 2015-02-19 DIAGNOSIS — I10 Essential (primary) hypertension: Secondary | ICD-10-CM | POA: Diagnosis not present

## 2015-02-19 DIAGNOSIS — Z87828 Personal history of other (healed) physical injury and trauma: Secondary | ICD-10-CM | POA: Diagnosis not present

## 2015-02-19 DIAGNOSIS — Z8639 Personal history of other endocrine, nutritional and metabolic disease: Secondary | ICD-10-CM | POA: Diagnosis not present

## 2015-02-19 DIAGNOSIS — Z880 Allergy status to penicillin: Secondary | ICD-10-CM | POA: Diagnosis not present

## 2015-02-19 DIAGNOSIS — Z791 Long term (current) use of non-steroidal anti-inflammatories (NSAID): Secondary | ICD-10-CM | POA: Insufficient documentation

## 2015-02-19 MED ORDER — HYDROCODONE-ACETAMINOPHEN 5-325 MG PO TABS: 2.0000 | ORAL_TABLET | ORAL | Status: AC | PRN

## 2015-02-19 MED ORDER — KETOROLAC TROMETHAMINE 60 MG/2ML IM SOLN
60.0000 mg | Freq: Once | INTRAMUSCULAR | Status: AC
  Administered 2015-02-19: 60 mg via INTRAMUSCULAR
  Filled 2015-02-19: qty 2

## 2015-02-19 MED ORDER — METHOCARBAMOL 500 MG PO TABS: 500.0000 mg | ORAL_TABLET | Freq: Two times a day (BID) | ORAL | Status: AC

## 2015-02-19 MED ORDER — LORAZEPAM 2 MG/ML IJ SOLN
1.0000 mg | Freq: Once | INTRAMUSCULAR | Status: AC
  Administered 2015-02-19: 1 mg via INTRAMUSCULAR
  Filled 2015-02-19: qty 1

## 2015-02-19 NOTE — Discharge Instructions (Signed)

## 2015-02-19 NOTE — ED Provider Notes (Signed)
CSN: [id number]     Arrival date & time 02/19/15  1238 History   First MD Initiated Contact with Patient 02/19/15 1302     Chief Complaint  Patient presents with  . Shoulder Pain     (Consider location/radiation/quality/duration/timing/severity/associated sxs/prior Treatment) Patient is a 49 y.o. male presenting with shoulder pain. No language interpreter was used.  Shoulder Pain Location:  Shoulder Time since incident:  2 days Injury: no   Shoulder location:  R shoulder Pain details:    Quality:  Throbbing   Radiates to:  R upper arm   Severity:  Moderate   Onset quality:  Gradual   Timing:  Constant   Progression:  Worsening Chronicity:  New Foreign body present:  No foreign bodies Prior injury to area:  Yes Relieved by:  Nothing Worsened by:  Nothing tried Ineffective treatments:  None tried Associated symptoms: back pain     Past Medical History  Diagnosis Date  . Renal disorder   . Kidney stone   . High cholesterol   . Hypertension    History reviewed. No pertinent past surgical history. History reviewed. No pertinent family history. History  Substance Use Topics  . Smoking status: Never Smoker   . Smokeless tobacco: Not on file  . Alcohol Use: No    Review of Systems  Musculoskeletal: Positive for back pain.  All other systems reviewed and are negative.     Allergies  Penicillins  Home Medications   Prior to Admission medications   Medication Sig Start Date End Date Taking? Authorizing Provider  ibuprofen (ADVIL,MOTRIN) 800 MG tablet Take 800 mg by mouth every 8 (eight) hours as needed.   Yes Historical Provider, MD  naproxen (NAPROSYN) 500 MG tablet Take 500 mg by mouth 2 (two) times daily with a meal.   Yes Historical Provider, MD  HYDROcodone-acetaminophen (NORCO/VICODIN) 5-325 MG per tablet Take 2 tablets by mouth every 4 (four) hours as needed. 02/19/15   Elson Areas, PA-C  methocarbamol (ROBAXIN) 500 MG tablet Take 1 tablet (500 mg total)  by mouth 2 (two) times daily. 02/19/15   Elson Areas, PA-C   BP 168/115 mmHg  Pulse 78  Temp(Src) 98.5 F (36.9 C) (Oral)  Resp 16  SpO2 99% Physical Exam  Constitutional: He is oriented to person, place, and time. He appears well-developed and well-nourished.  HENT:  Head: Normocephalic and atraumatic.  Eyes: EOM are normal. Pupils are equal, round, and reactive to light.  Neck: Normal range of motion.  Cardiovascular: Normal rate.   Pulmonary/Chest: Effort normal.  Abdominal: He exhibits no distension.  Musculoskeletal:  Near frozen right shoulder,  Pain with palpation, pain with movement.  Neurological: He is alert and oriented to person, place, and time.  Psychiatric: He has a normal mood and affect.  Nursing note and vitals reviewed.   ED Course  Procedures (including critical care time) Labs Review Labs Reviewed - No data to display  Imaging Review Dg Shoulder Right  02/17/2015   CLINICAL DATA:  RIGHT shoulder pain since 4 p.m.  Work injury.  EXAM: RIGHT SHOULDER - 2+ VIEW  COMPARISON:  None.  FINDINGS: Glenohumeral joint is intact. No evidence of scapular fracture or humeral fracture. The acromioclavicular joint is intact.  IMPRESSION: No fracture or dislocation.   Electronically Signed   By: Genevive Bi M.D.   On: 02/17/2015 20:26     EKG Interpretation None      MDM   Final diagnoses:  Shoulder pain, right  Torodol  Ativan  Hydrocodone Robaxin  Follow up with [facility] as scheduled tomorrow.    Lonia Skinner Keller, PA-C 02/19/15 1323  Rolland Porter, MD 02/24/15 [phone]

## 2015-02-19 NOTE — ED Notes (Signed)
Patient reports that he still is in pain and reports that it is increasing since his injury on Thursday. Patient reports that he is very uncomfortable and nothing is helping. Also reports that he is having neck pain.

## 2016-02-28 IMAGING — CT CT RENAL STONE PROTOCOL
2 of 4 series · 17 of 46 positions shown, 19 images · non-contrast
Comparison: 11/05/2007

CLINICAL DATA: Right flank pain.

EXAM:
CT RENAL STONE PROTOCOL
TECHNIQUE: Multidetector CT imaging of the abdomen and pelvis was performed
following the standard protocol without intravenous contrast

[Series 2: renal stone > 200 lbs 5.0 b31f · axial · 0.80mm/px · z∈[+788,+1268]mm · 14 of 104 slices shown, 16 images]
[im 4/104  soft-tissue]
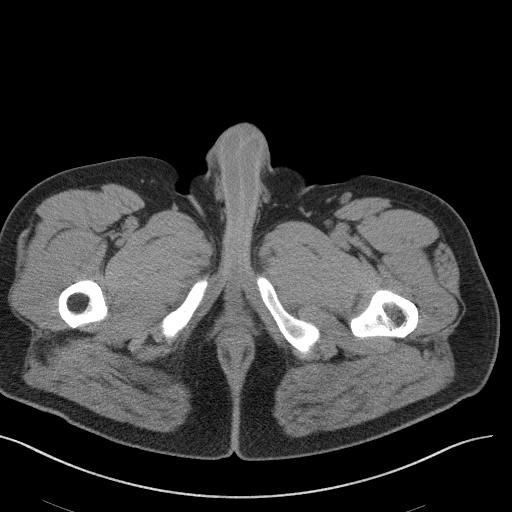
[im 4/104  bone]
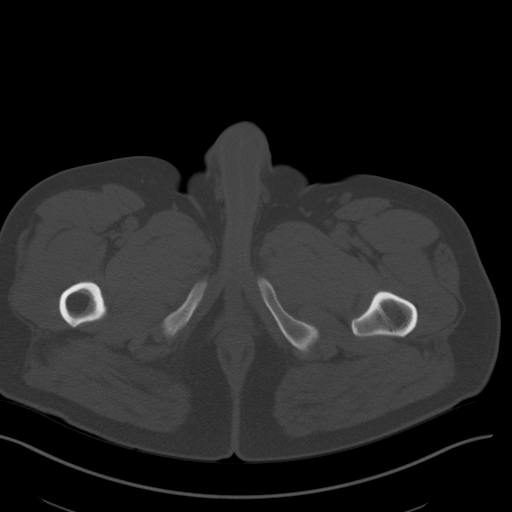
[im 12/104  soft-tissue]
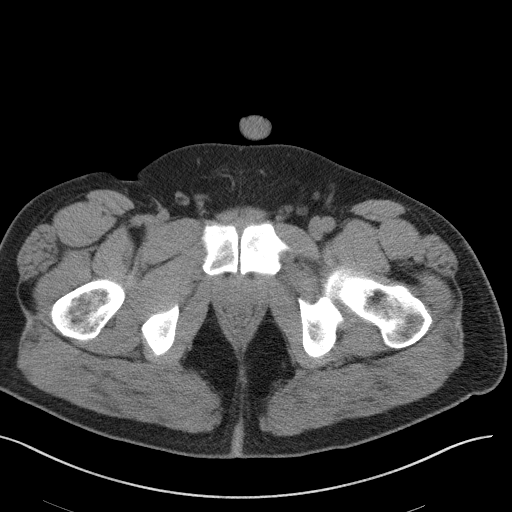
[im 20/104  soft-tissue]
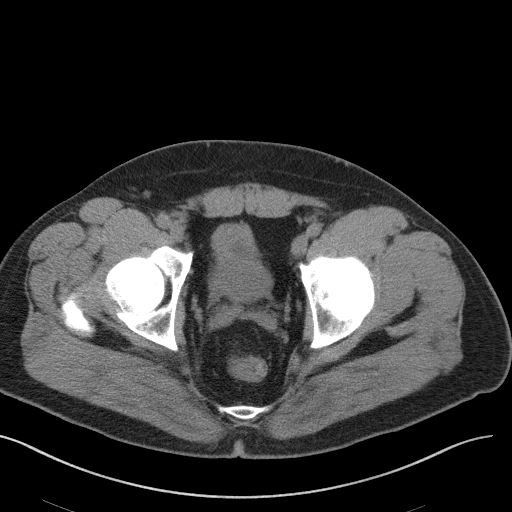
[im 27/104  soft-tissue]
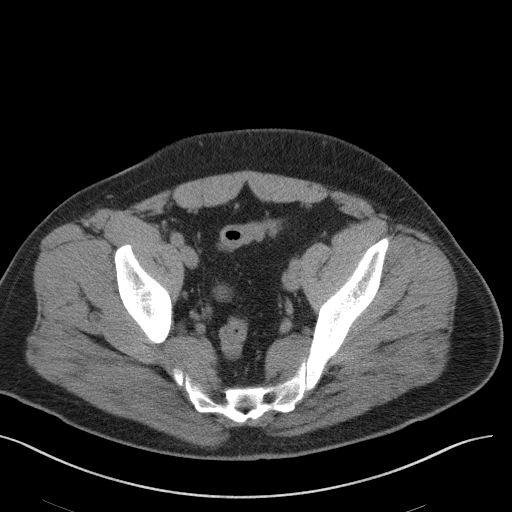
[im 35/104  soft-tissue]
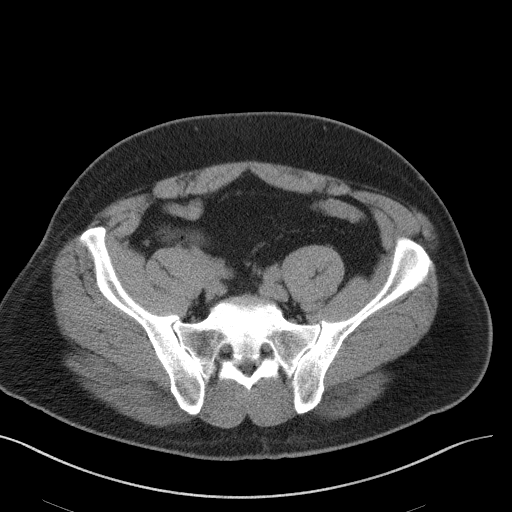
[im 42/104  soft-tissue]
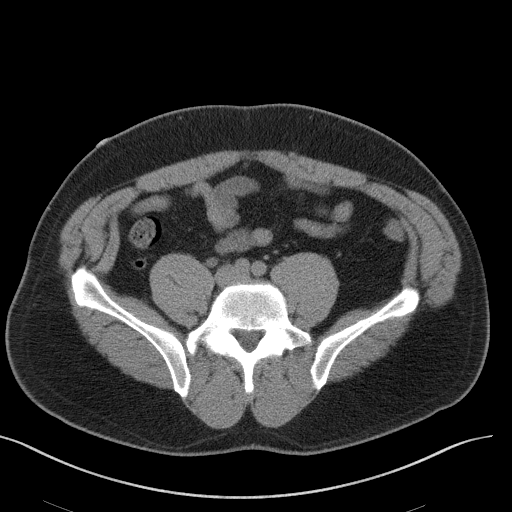
[im 50/104  soft-tissue]
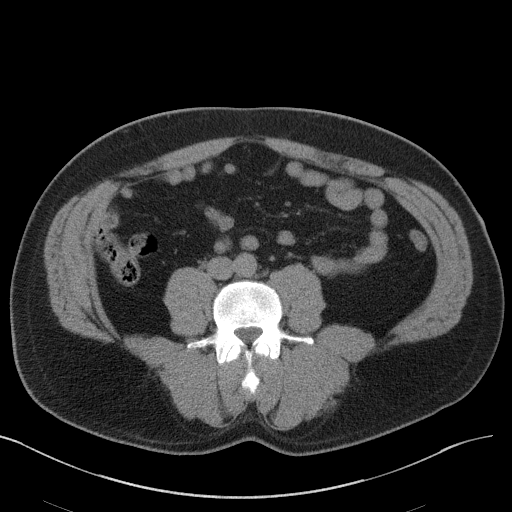
[im 54/104  soft-tissue]
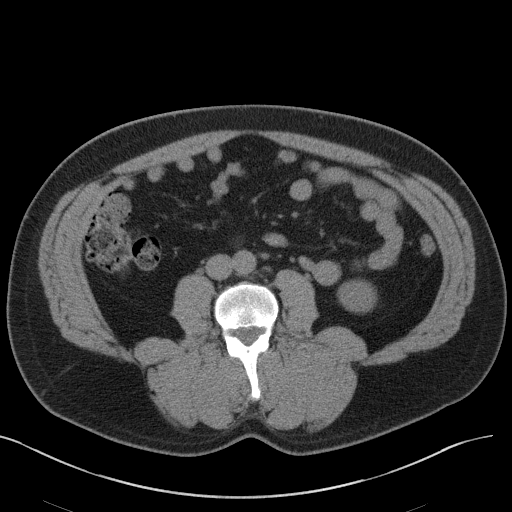
[im 62/104  soft-tissue]
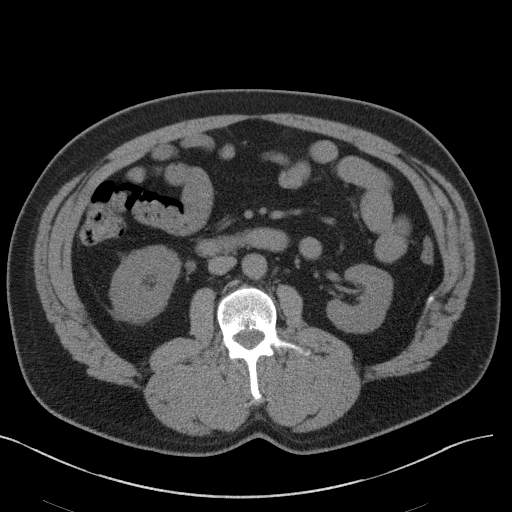
[im 62/104  bone]
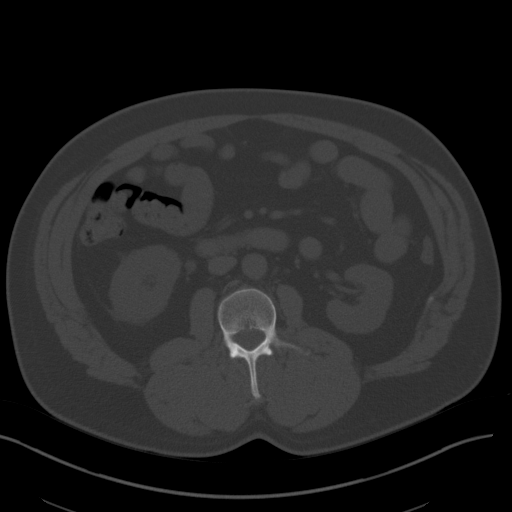
[im 69/104  soft-tissue]
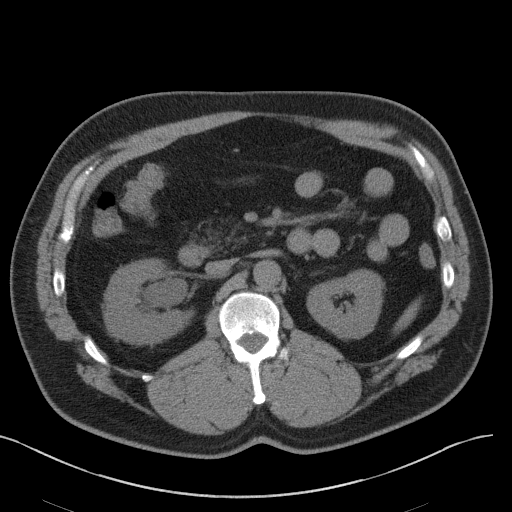
[im 77/104  soft-tissue]
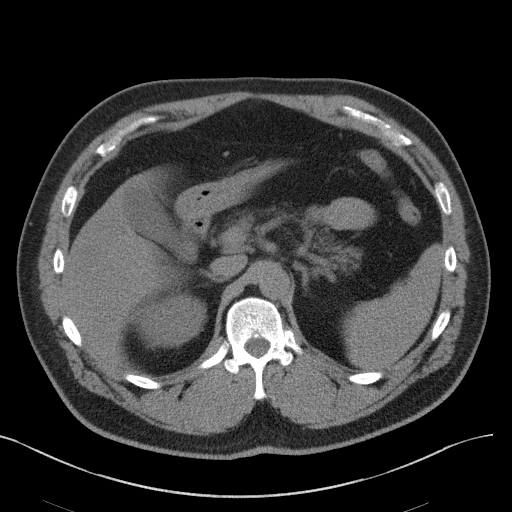
[im 84/104  soft-tissue]
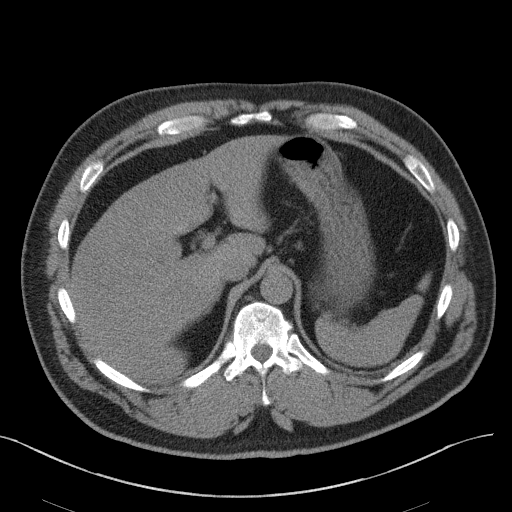
[im 92/104  soft-tissue]
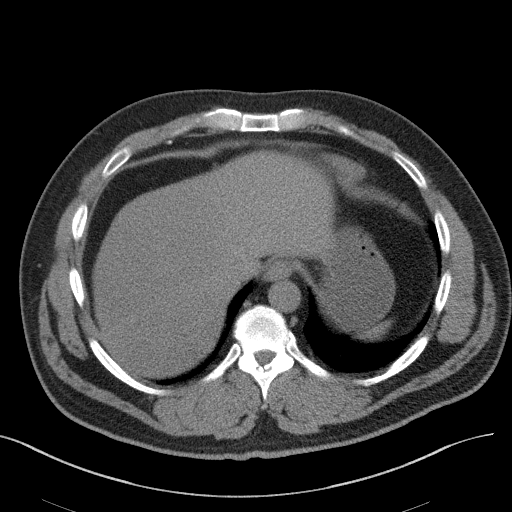
[im 100/104  soft-tissue]
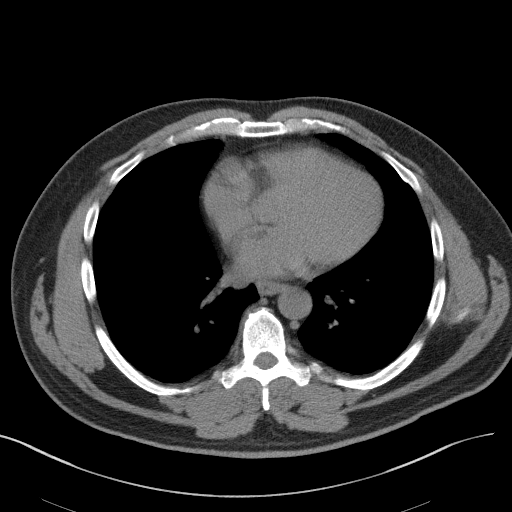

[Series 5: renal stone 3.0 coronal · coronal · 1.51mm/px · 3 of 102 slices shown]
[im 34/102  soft-tissue]
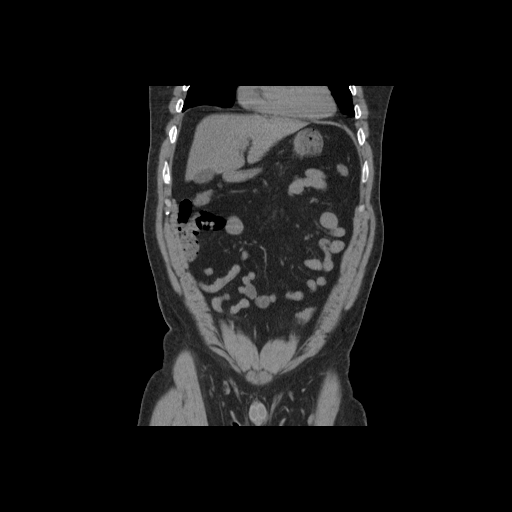
[im 45/102  soft-tissue]
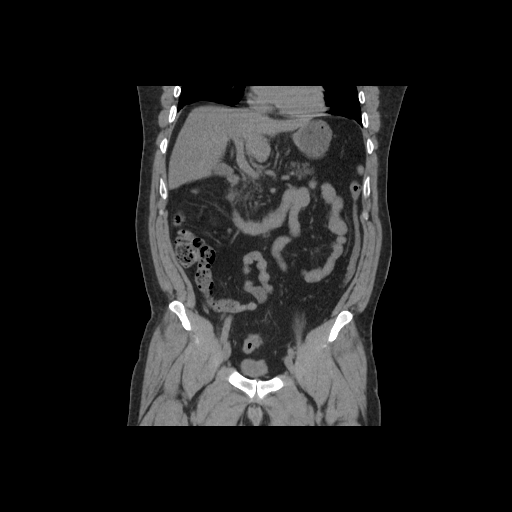
[im 57/102  soft-tissue]
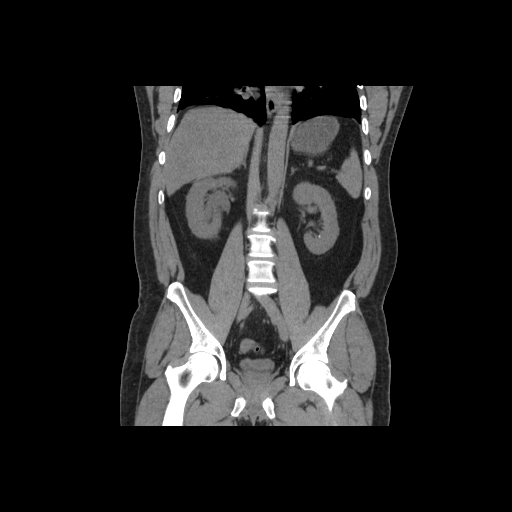

[17 of 46 positions shown; findings below may reference images not displayed]

FINDINGS: The lung bases appear clear.  No pleural or pericardial effusion.

There is no liver abnormality. Gallbladder appears normal. No
biliary dilatation. Normal appearance of the pancreas and spleen.

The adrenal glands are both normal. Stone within the inferior pole
of the left kidney measures 4 mm, image 47/series 2. There is right
hydronephrosis an eye drape ureter. Stone within the urinary bladder
is identified near the right UVJ. This measures 2-3 mm, image
86/series 2. There is no stone within the right ureter. The prostate
gland and seminal vesicles are unremarkable.

Normal caliber of the abdominal aorta. No aneurysm. There is no
retroperitoneal adenopathy identified. No pelvic or inguinal
adenopathy. There is no free fluid or fluid collections within the
abdomen or pelvis.

The stomach is normal. The small bowel loops have a normal course
and caliber. Normal appearance of the appendix. Normal appearance of
the colon.

Review of the visualized osseous structures is unremarkable.
IMPRESSION: 1. Right hydronephrosis and hydroureter. Stone in the urinary
bladder is identified which may have just passed from the right
ureter. This measures 2-3 mm.
2. Nonobstructing left renal calculus.

## 2016-07-16 IMAGING — US US RENAL
1 series · 14 of 25 positions shown · non-contrast
Comparison: CT 03/23/2014

CLINICAL DATA: 48-year-old with mid abdominal pain. History of
kidney stone.

EXAM:
RENAL/URINARY TRACT ULTRASOUND COMPLETE

[Series 1: us renal · 0.25mm/px · 14 of 32 slices shown]
[im 1/32]
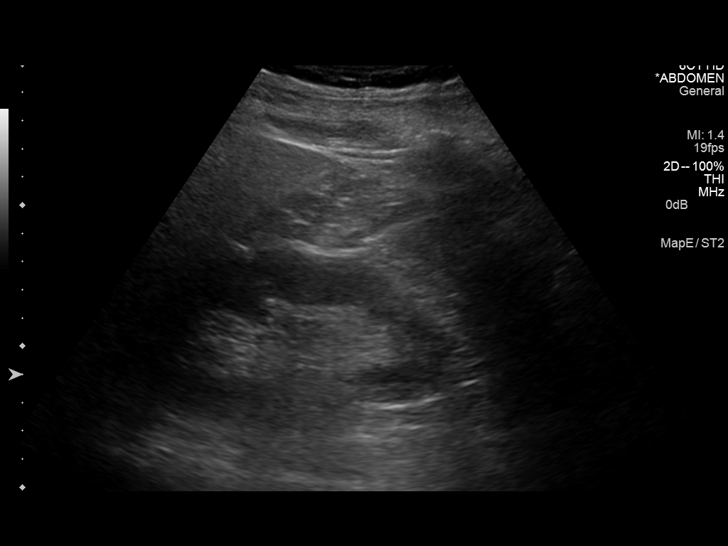
[im 3/32]
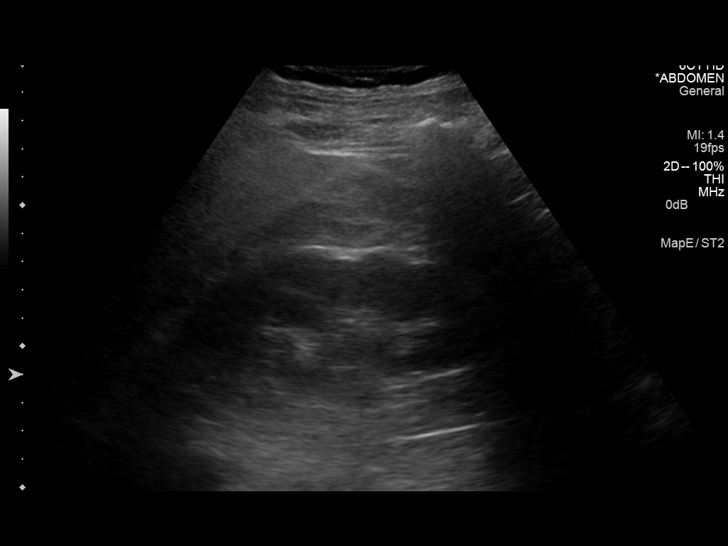
[im 6/32]
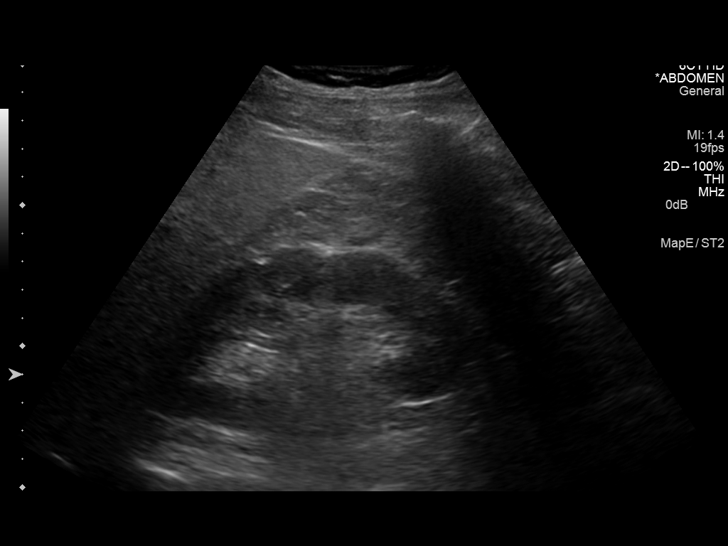
[im 8/32]
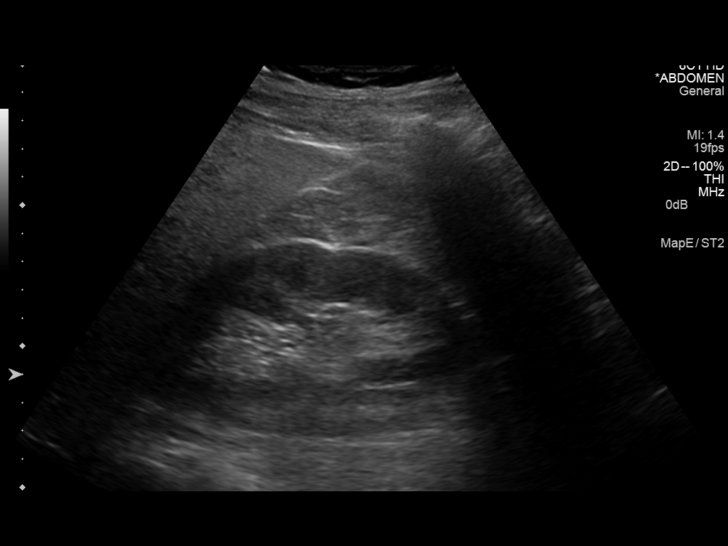
[im 11/32]
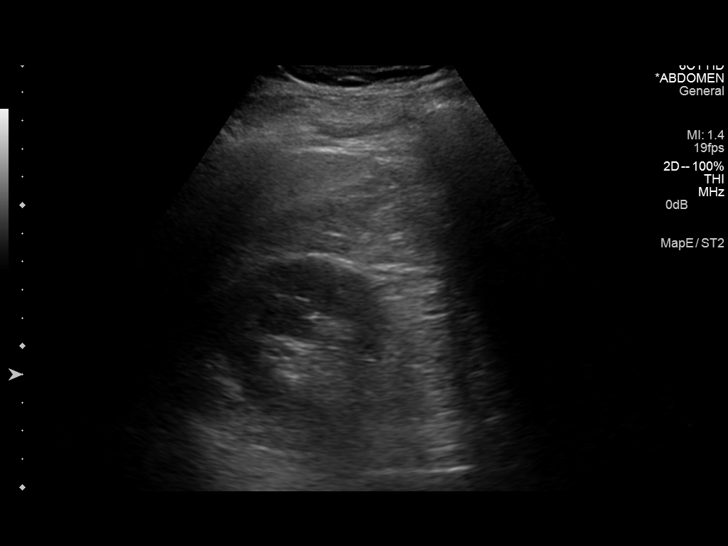
[im 12/32]
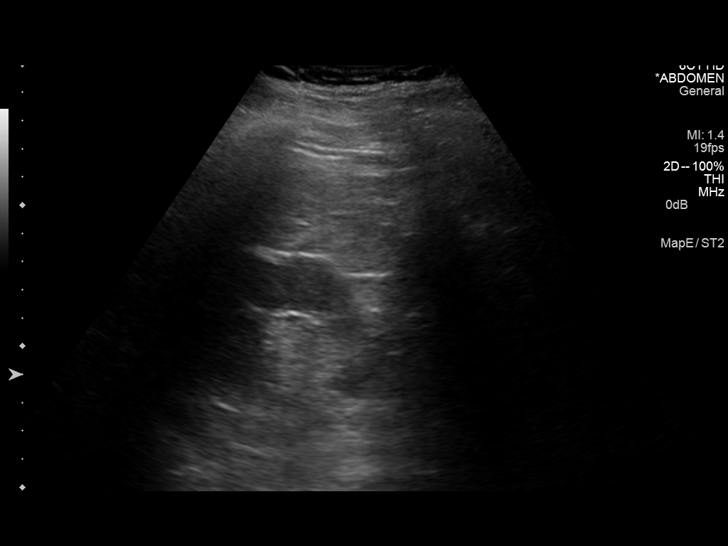
[im 15/32]
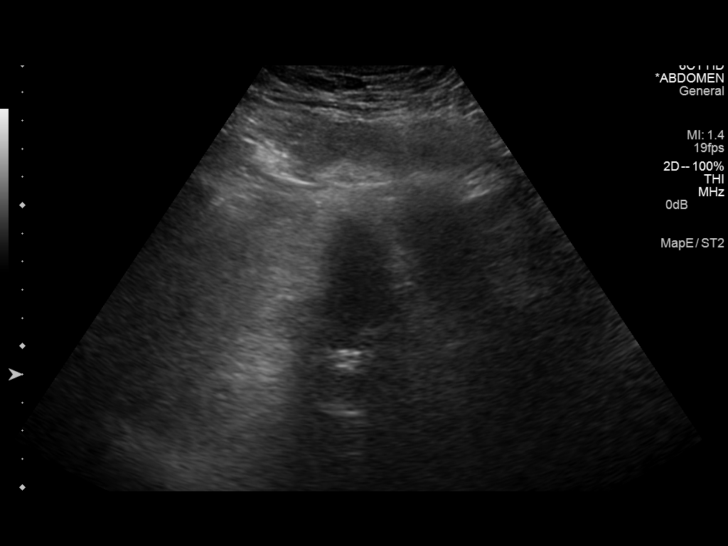
[im 17/32]
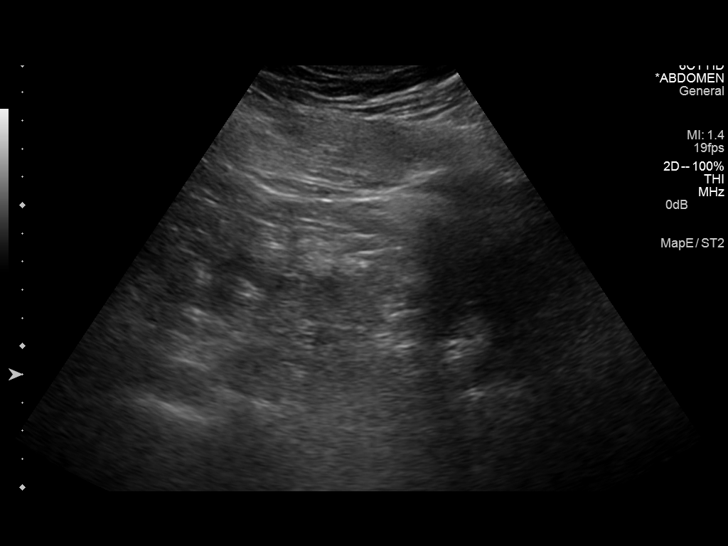
[im 20/32]
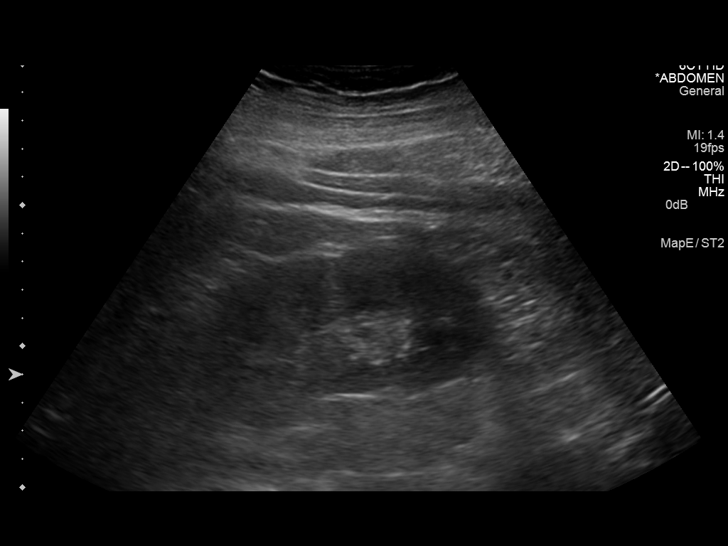
[im 21/32]
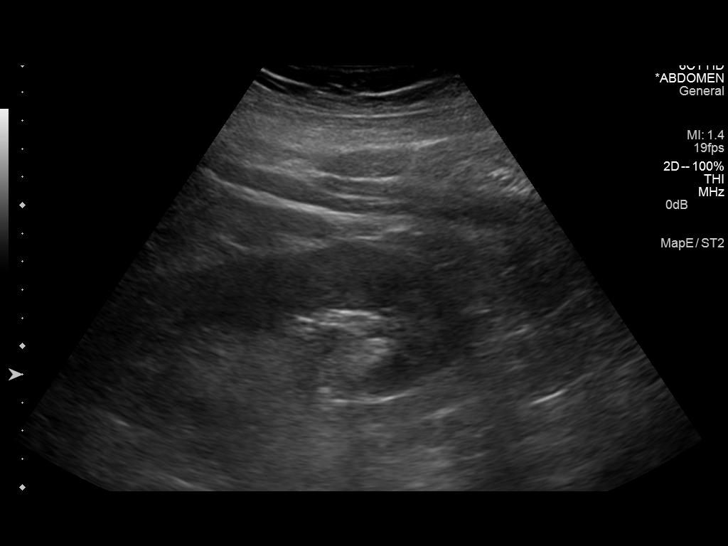
[im 24/32]
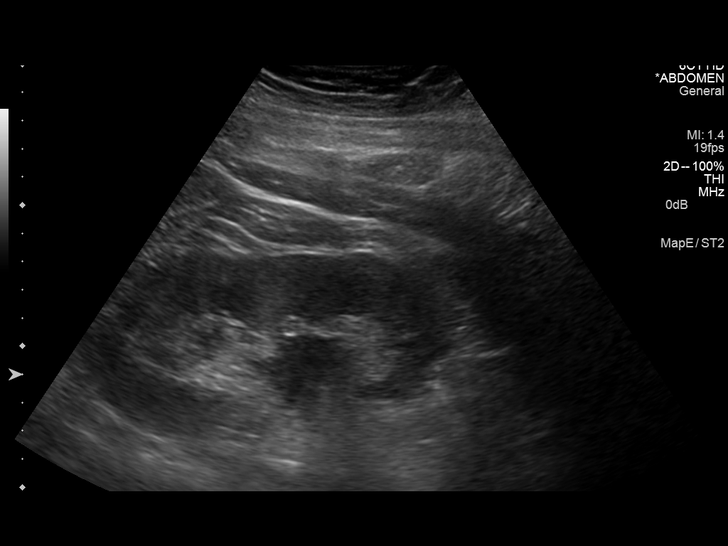
[im 26/32]
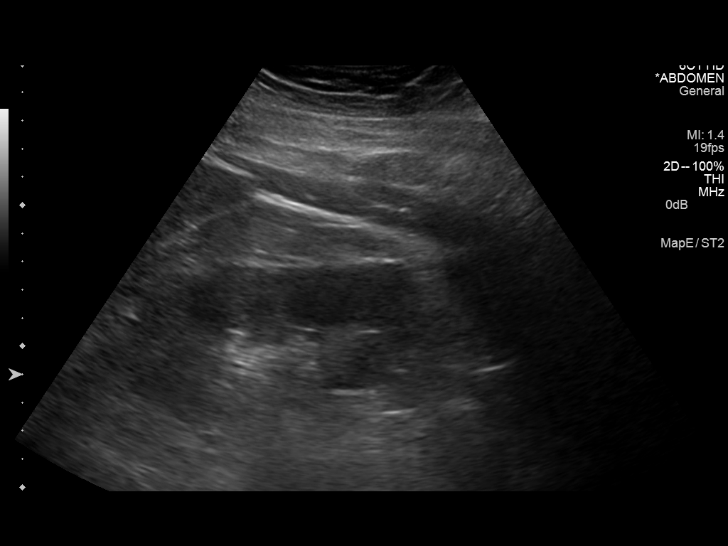
[im 29/32]
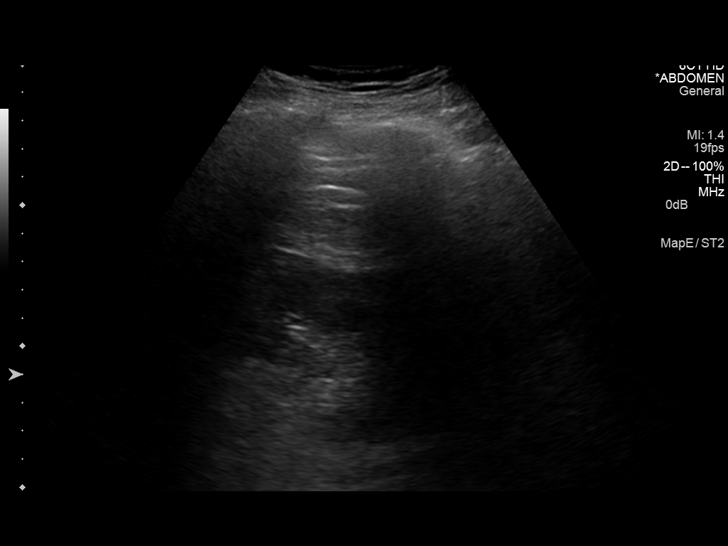
[im 32/32]
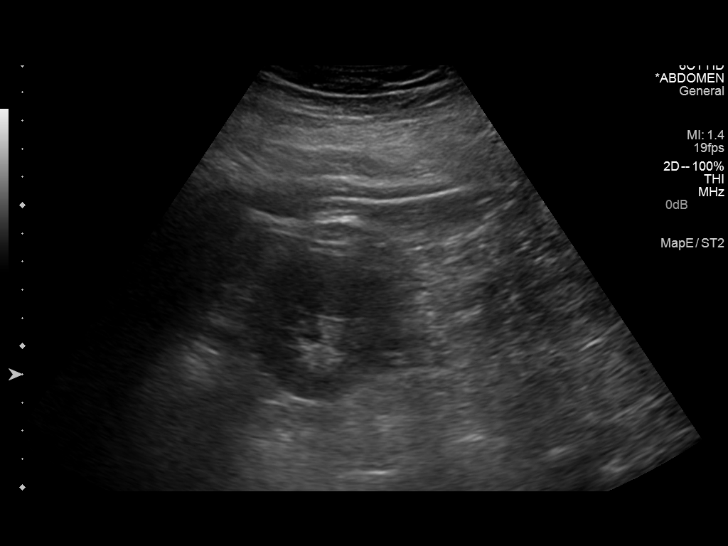

[14 of 25 positions shown; findings below may reference images not displayed]

FINDINGS: Right Kidney:

Length: 12.6 cm. Echogenicity within normal limits. No mass or
hydronephrosis visualized.

Left Kidney:

Length: 14.1 cm. Normal echogenicity in the left kidney. There is
fullness of the left renal pelvis.

Bladder:

Bladder is decompressed and poorly visualized.
IMPRESSION: Mild left hydronephrosis of unknown etiology. This may be further
characterized with CT.

## 2017-01-24 IMAGING — DX DG SHOULDER 2+V*R*
2 series · 2 of 2 positions shown · non-contrast
Comparison: None.

CLINICAL DATA: RIGHT shoulder pain since 4 p.m..  Work injury.

EXAM:
RIGHT SHOULDER - 2+ VIEW

[shoulder grashey]
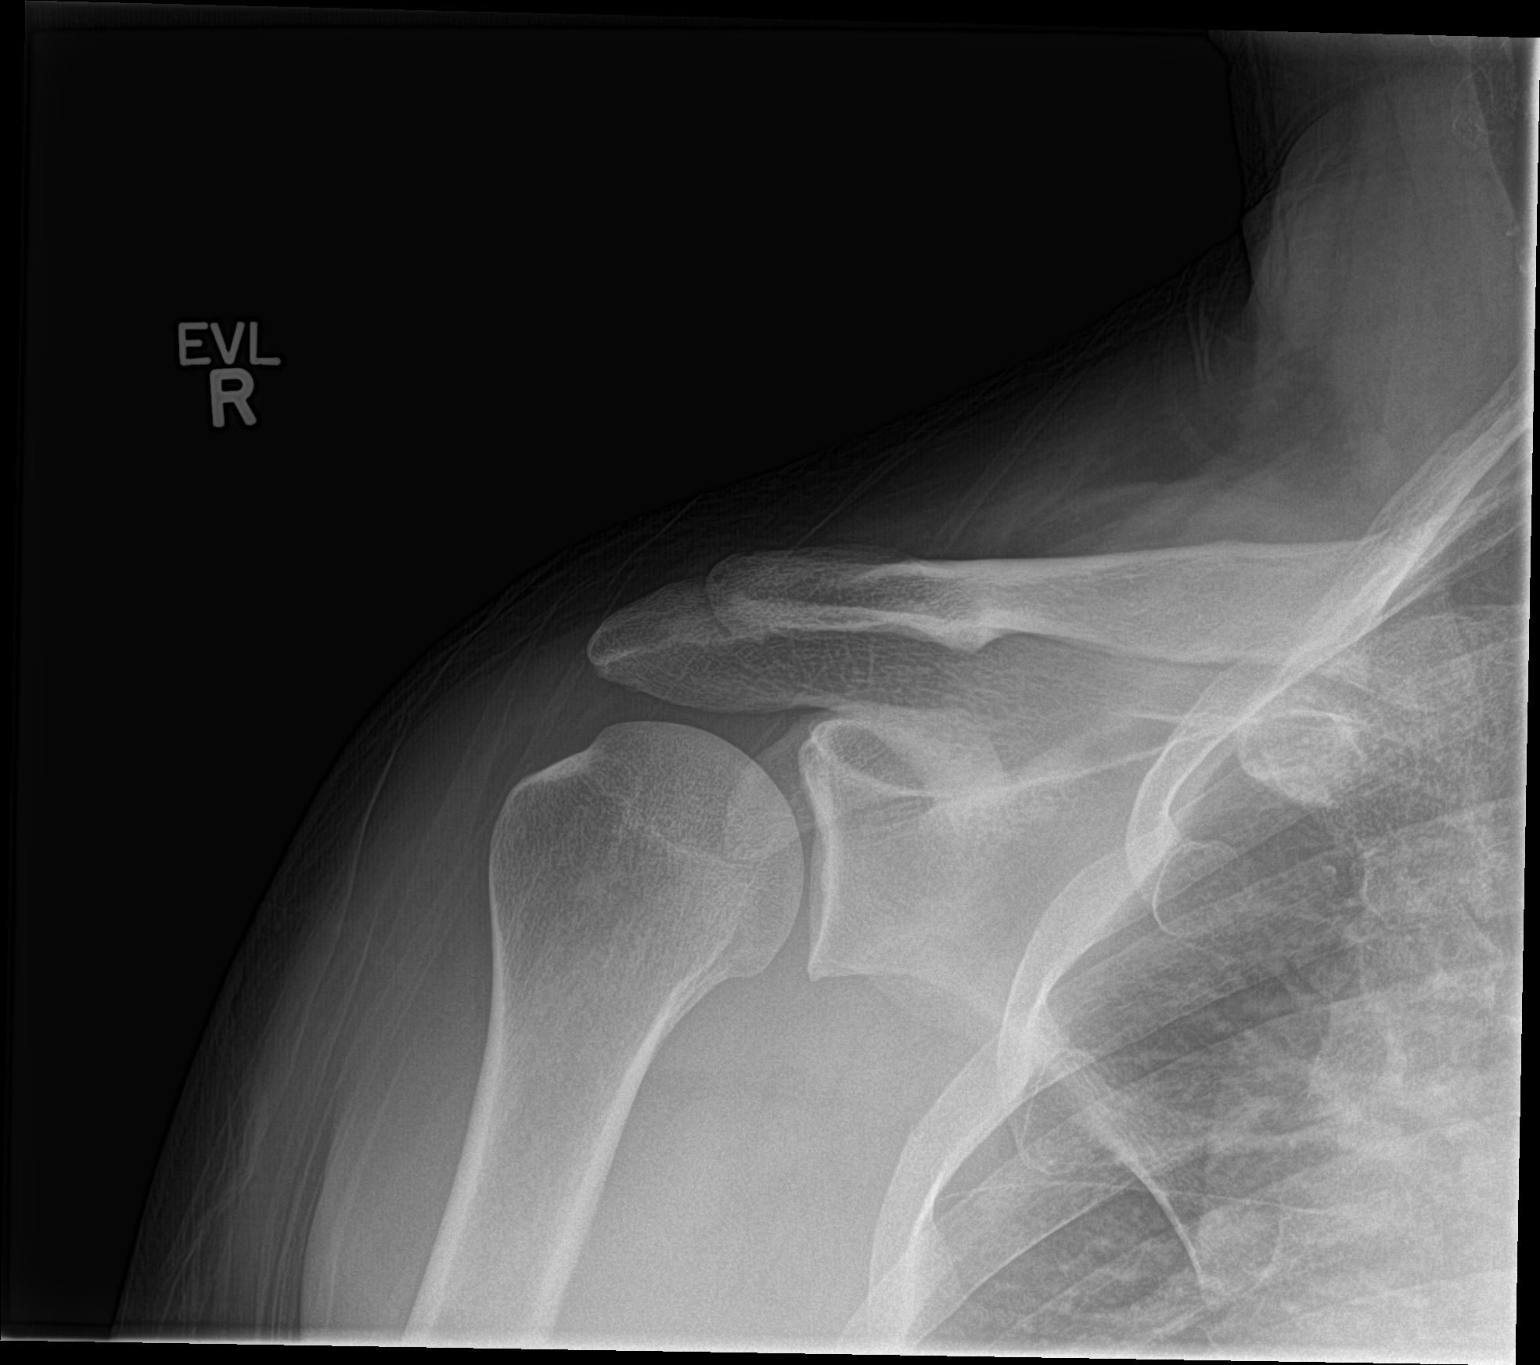

[shoulder y view]
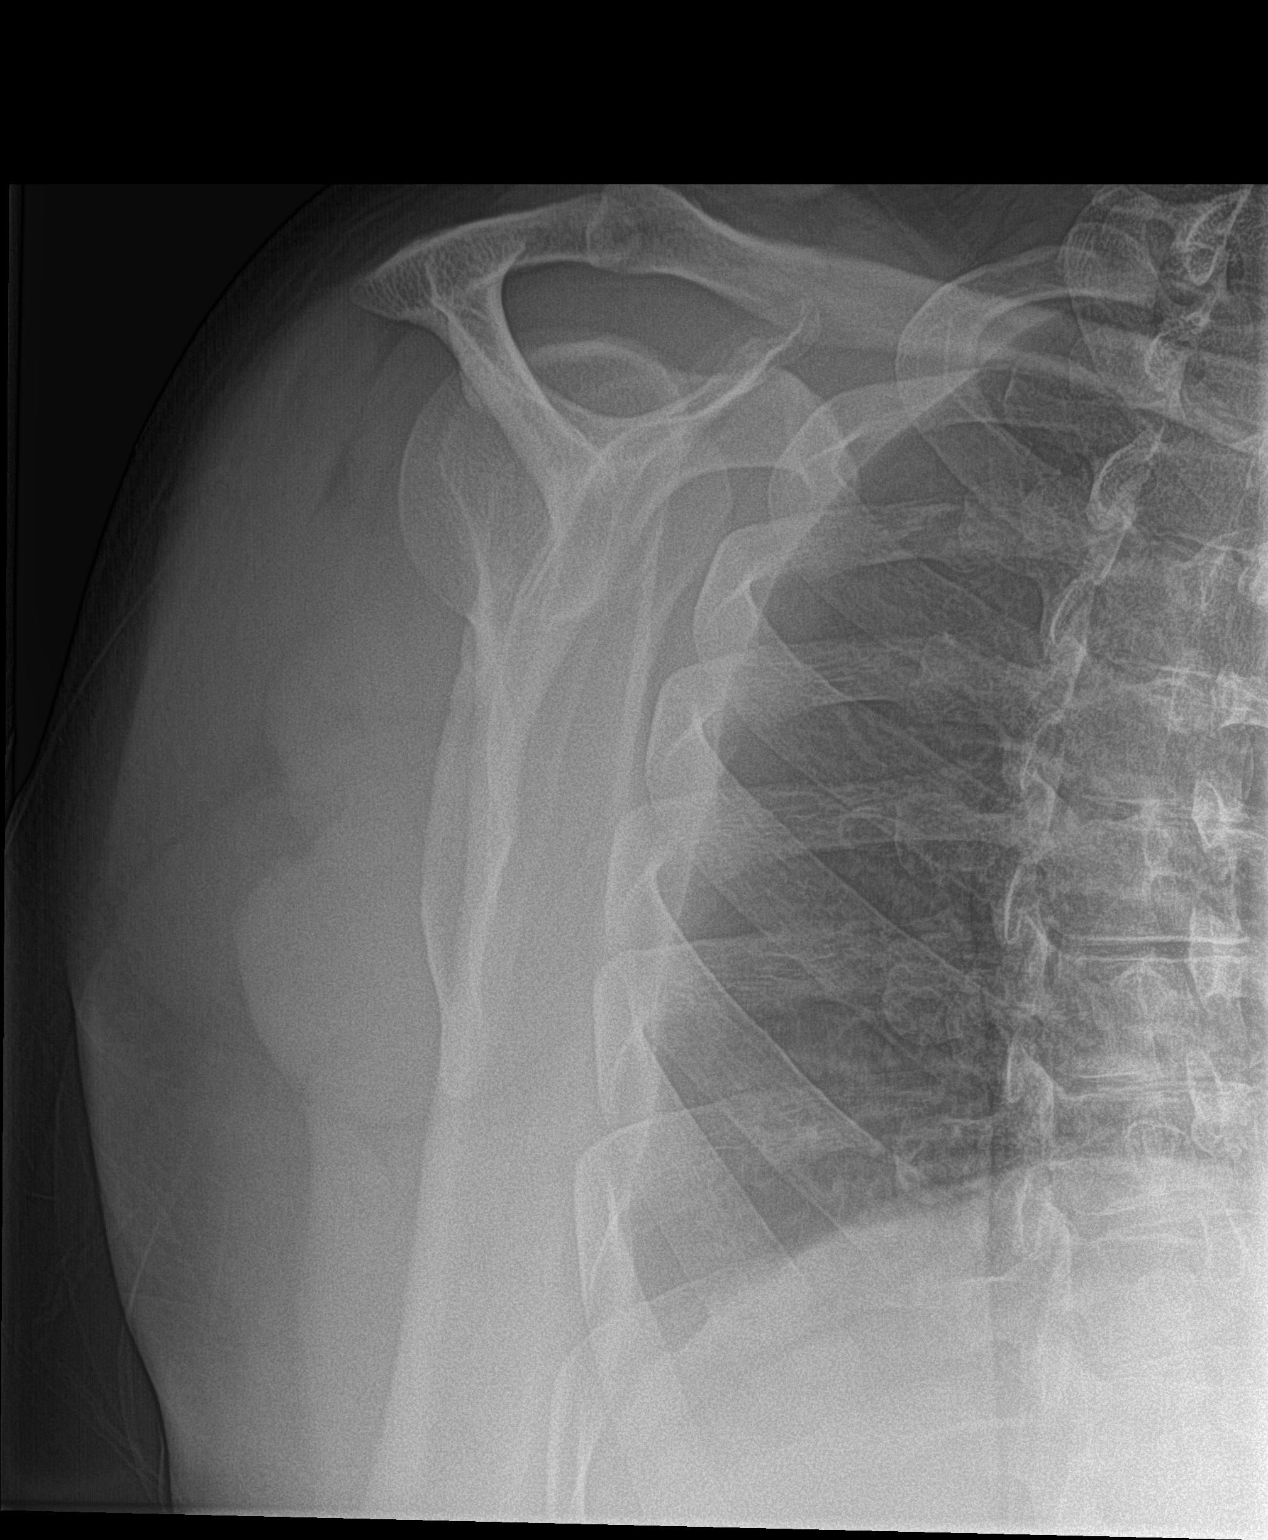

[2 of 2 positions shown; findings below may reference images not displayed]

FINDINGS: Glenohumeral joint is intact. No evidence of scapular fracture or
humeral fracture. The acromioclavicular joint is intact.
IMPRESSION: No fracture or dislocation.

## 2020-12-21 ENCOUNTER — Other Ambulatory Visit (HOSPITAL_COMMUNITY): Payer: Self-pay | Admitting: Registered Nurse

## 2020-12-21 ENCOUNTER — Other Ambulatory Visit: Payer: Self-pay | Admitting: Registered Nurse

## 2020-12-21 DIAGNOSIS — M545 Low back pain, unspecified: Secondary | ICD-10-CM

## 2020-12-21 DIAGNOSIS — R29898 Other symptoms and signs involving the musculoskeletal system: Secondary | ICD-10-CM

## 2020-12-21 DIAGNOSIS — S22009A Unspecified fracture of unspecified thoracic vertebra, initial encounter for closed fracture: Secondary | ICD-10-CM

## 2020-12-21 DIAGNOSIS — S2241XA Multiple fractures of ribs, right side, initial encounter for closed fracture: Secondary | ICD-10-CM

## 2020-12-28 ENCOUNTER — Ambulatory Visit (HOSPITAL_COMMUNITY): Payer: 59

## 2021-01-04 ENCOUNTER — Ambulatory Visit (HOSPITAL_COMMUNITY): Payer: 59

## 2021-01-04 ENCOUNTER — Inpatient Hospital Stay (HOSPITAL_COMMUNITY): Admission: RE | Admit: 2021-01-04 | Payer: 59 | Source: Ambulatory Visit

## 2021-01-04 ENCOUNTER — Encounter (HOSPITAL_COMMUNITY): Payer: Self-pay

## 2021-01-11 ENCOUNTER — Ambulatory Visit (HOSPITAL_COMMUNITY): Payer: 59
